# Patient Record
Sex: Male | Born: 1975 | Hispanic: Yes | Marital: Single | Smoking: Never smoker
Health system: Southern US, Community
[De-identification: ages and names within clinical notes are randomized; demographics above are authoritative.]

## PROBLEM LIST (undated history)

## (undated) HISTORY — PX: GASTRIC BYPASS: SHX52

## (undated) HISTORY — PX: CORONARY ARTERY BYPASS GRAFT: SHX141

---

## 2007-06-01 ENCOUNTER — Emergency Department: Payer: Self-pay | Admitting: Unknown Physician Specialty

## 2007-06-03 ENCOUNTER — Emergency Department: Payer: Self-pay | Admitting: Emergency Medicine

## 2008-06-21 IMAGING — CT CT STONE STUDY
1 of 2 series · 15 of 32 positions shown, 19 images · non-contrast
Comparison: none

REASON FOR EXAM: flank pain, [HOSPITAL]
COMMENTS:

[Series 2: stone · axial · 0.79mm/px · z∈[-534,-123]mm · 15 of 154 slices shown, 19 images]
[im 11/154  soft-tissue]
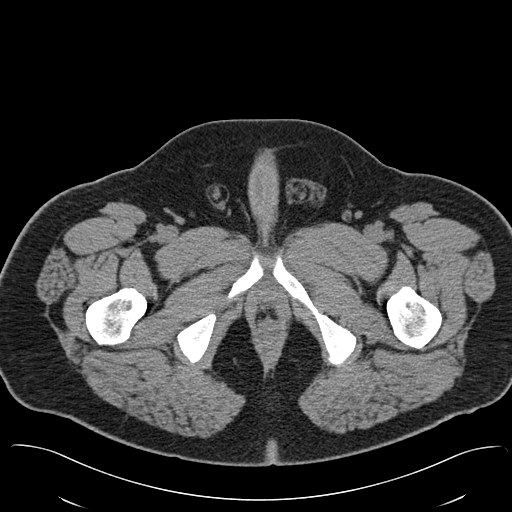
[im 11/154  bone]
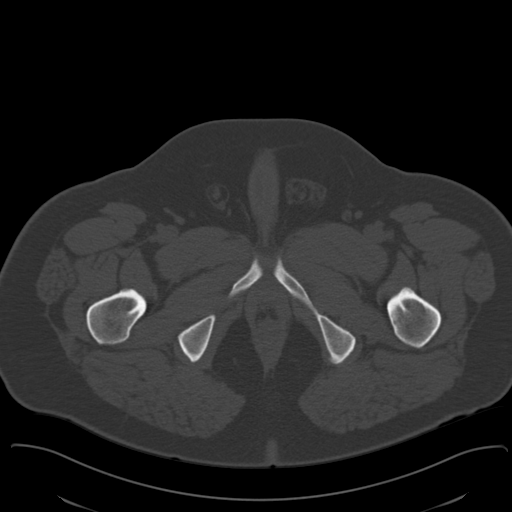
[im 22/154  soft-tissue]
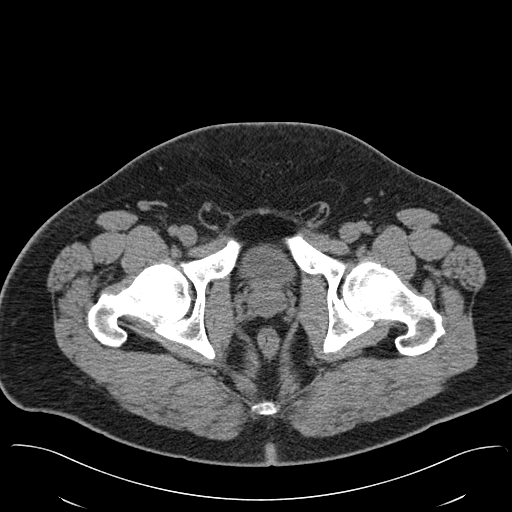
[im 32/154  soft-tissue]
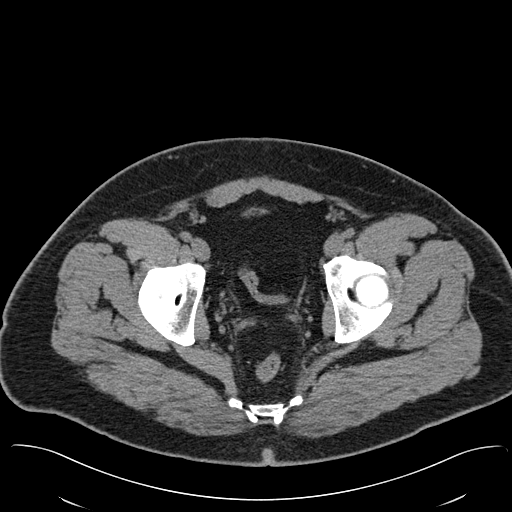
[im 43/154  soft-tissue]
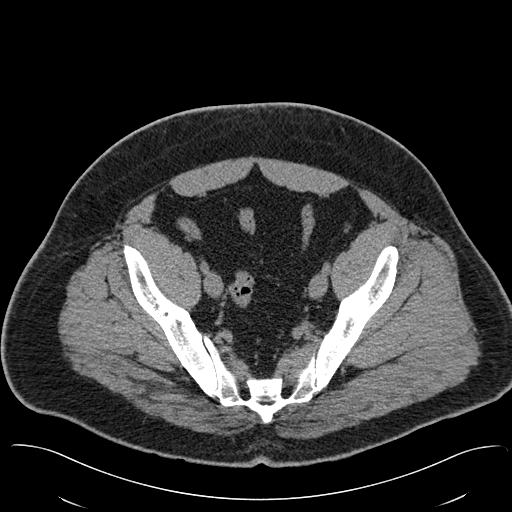
[im 53/154  soft-tissue]
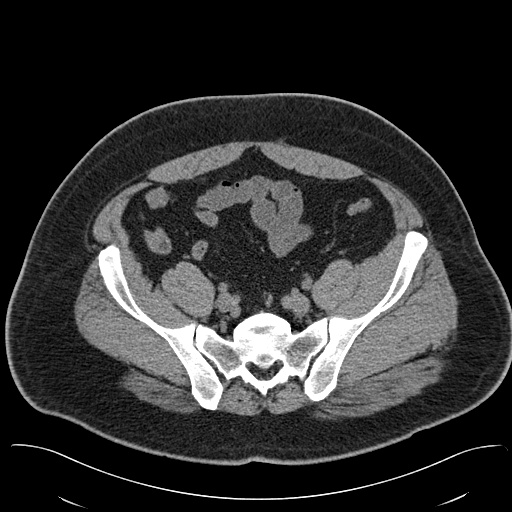
[im 64/154  soft-tissue]
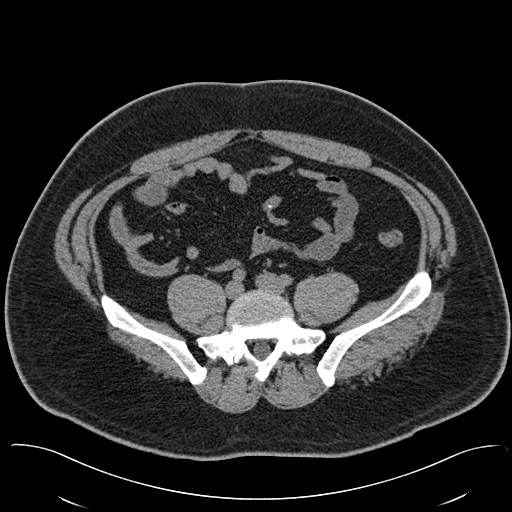
[im 80/154  soft-tissue]
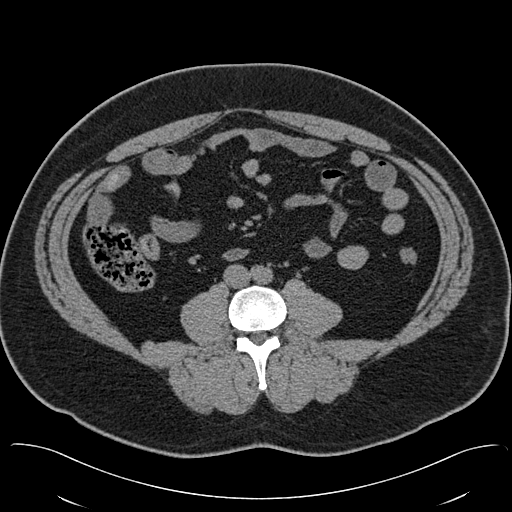
[im 90/154  soft-tissue]
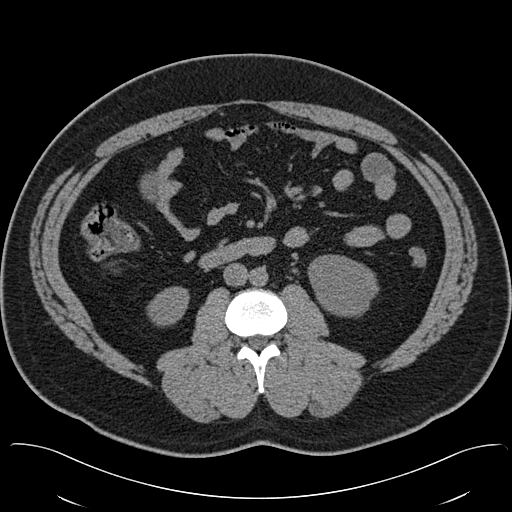
[im 101/154  soft-tissue]
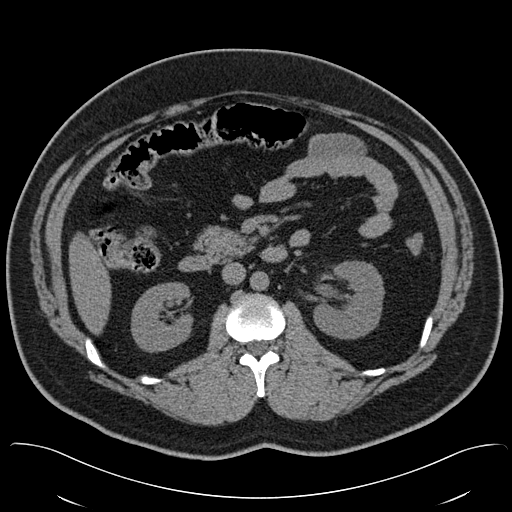
[im 101/154  bone]
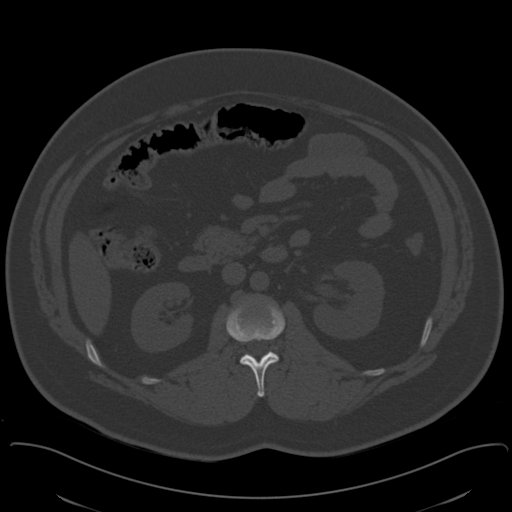
[im 111/154  soft-tissue]
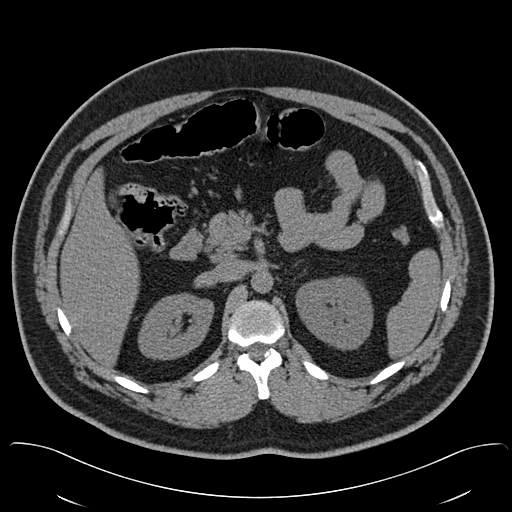
[im 122/154  soft-tissue]
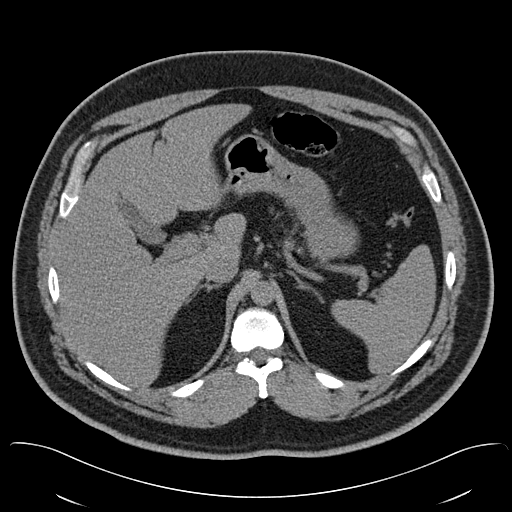
[im 132/154  soft-tissue]
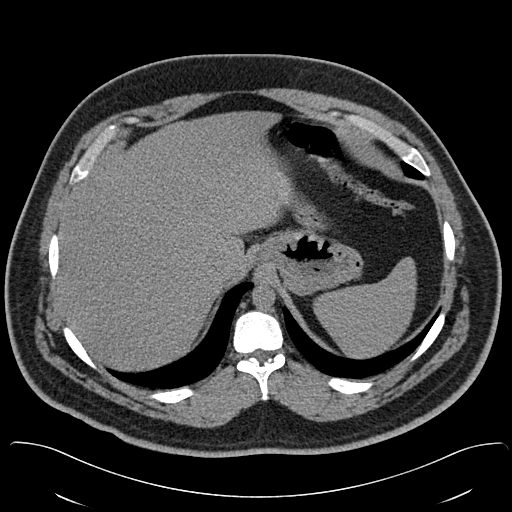
[im 132/154  lung]
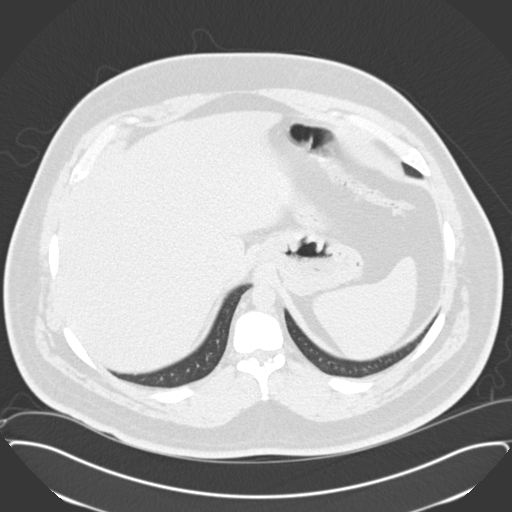
[im 138/154  lung]
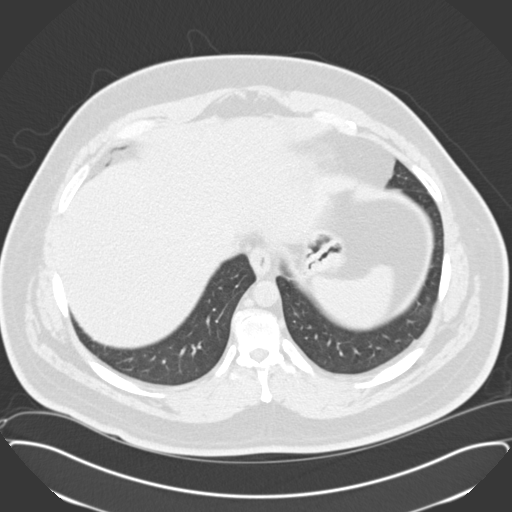
[im 143/154  soft-tissue]
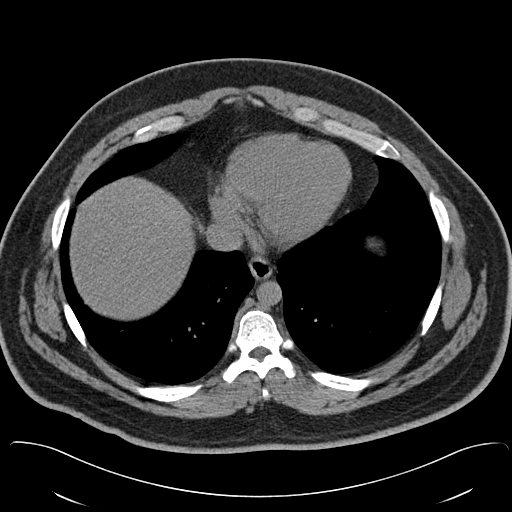
[im 143/154  lung]
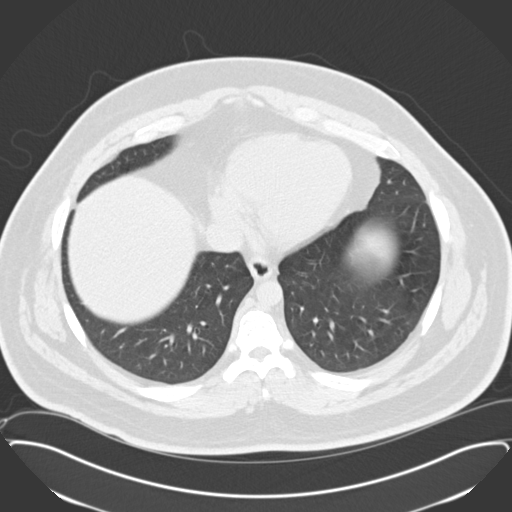
[im 148/154  lung]
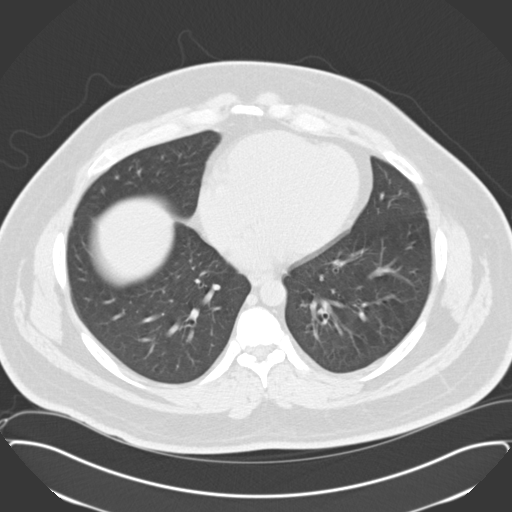

[15 of 32 positions shown; findings below may reference images not displayed]

PROCEDURE:     CT  - CT ABDOMEN /PELVIS WO (STONE)  - June 01, 2007  [DATE]

RESULT:       Helical noncontrasted 3-mm sections were obtained from the
lung bases through the pubic symphysis.

Evaluation of the lung bases demonstrates a calcified granuloma within the
anterior base of the RIGHT middle lobe.  No further gross abnormalities are
appreciated.

Evaluation of the LEFT kidney demonstrates mild hydronephrosis as well as
mild to moderate pelviectasis. A 3.5 mm calculus is demonstrated within the
proximal LEFT ureter just distal to the ureterovesical junction. Within the
limitations of a noncontrasted CT, the liver, spleen, RIGHT kidney, pancreas
and adrenals are unremarkable.  There is no evidence of abdominal or pelvic
free fluid, drainable loculated fluid collections, masses or adenopathy or
the sequela associated with bowel obstruction, diverticulitis, colitis or
appendicitis are appreciated.
IMPRESSION: 1.     Proximal LEFT ureteral calculus with associated mild to slightly
moderate obstructive uropathy.
2.     Dr. Quirijn of the Emergency Department was informed of these findings
via preliminary fax report on 06/01/07 at [DATE] a.m. CST.

## 2019-07-20 ENCOUNTER — Encounter: Payer: Self-pay | Admitting: Cardiovascular Disease

## 2019-08-08 NOTE — Progress Notes (Signed)
Cardiology Office Note  Date:  08/10/2019   ID:  Robert [name], DOB 1975-09-16, MRN [id number]  PCP:  Theotis Burrow, MD   Chief Complaint  Patient presents with  . New Patient (Initial Visit)    Bradycardia-Patient reports dizziness x 2 weeks; Meds verbally reviewed with patient.    HPI:  [name] is a 44 year old gentleman with past medical history of Gastric bypass, weight  338 to 161 over the past year Referred by Dr. 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  PSH:    Past Surgical History:  Procedure Laterality Date  . CORONARY ARTERY BYPASS GRAFT      Current Outpatient Medications  Medication Sig Dispense Refill  . Multiple Vitamins-Minerals (MULTIVITAMIN ADULT PO) Take by mouth daily.     No current facility-administered medications for this visit.     Allergies:   Patient has no known allergies.   Social History:  The patient  reports that he has never smoked. He has never used smokeless tobacco. He reports previous alcohol use. He reports that he does not use drugs.   Family  History:   family history includes Hypertension in his mother.    Review of Systems: Review of Systems  Constitutional: Negative.   HENT: Negative.   Respiratory: Negative.   Cardiovascular: Negative.   Gastrointestinal: Negative.   Musculoskeletal: Negative.   Neurological: Positive for dizziness.  Psychiatric/Behavioral: Negative.   All other systems reviewed and are negative.    PHYSICAL EXAM: VS:  BP 107/66 (BP Location: Right Arm, Patient Position: Sitting, Cuff Size: Normal)   Pulse (!) 48   Ht 5\' 4"  (1.626 m)   Wt 161 lb 4 oz (73.1 kg)   SpO2 99%   BMI 27.68 kg/m  , BMI Body mass index is 27.68 kg/m. GEN: Well nourished, well developed, in no acute distress HEENT: normal Neck: no JVD, carotid bruits, or masses Cardiac: RRR; no murmurs, rubs, or gallops,no edema  Respiratory:  clear to auscultation bilaterally, normal work of breathing GI: soft, nontender, nondistended, + BS MS: no deformity or atrophy Skin: warm and dry, no rash Neuro:  Strength and sensation are intact Psych: euthymic mood, full affect    Recent Labs: No results found for requested labs within last 8760 hours.    Lipid Panel No results found for: CHOL, HDL, LDLCALC, TRIG    Wt Readings from Last 3 Encounters:  08/10/19 161 lb 4 oz (73.1 kg)     ASSESSMENT AND PLAN:  Problem List Items Addressed This Visit    None  Visit Diagnoses    Bradycardia    -  Primary   History of gastric bypass       Weight loss       Orthostasis         Dramatic weight loss over the past year 160 pounds dramatic weight loss over the past year 160 pounds or so Exacerbated by recent 10 pound weight loss after developing GI disorder/Salmonella while traveling Does not drink much fluids in the daytime, simple couple coffee in the morning and does not eat till 9 AM --Mild orthostasis symptoms today Bradycardia less of an issue Biggest contributor to his symptoms is dramatic weight loss causing low  blood pressure -Recommend he limit further weight loss at this time -Suggested better hydration especially in the mornings and throughout his work shift  Orthostasis Recommended compression hose, back brace, hydration If symptoms do get worse, could potentially use midodrine or Florinef Or encouraged weight gain He does not need to avoid salt  Bradycardia Likely a chronic issue, pacemaker not indicated Recommended better hydration, avoiding prolonged periods of bending over and getting up too quickly   Disposition:   F/U as needed   Total encounter time more than 60 minutes  Greater than 50% was spent in counseling and coordination of care with the patient  Patient was seen in consultation for [facility] and will be referred back to his office for ongoing care of the issues detailed above   Signed, Dossie Arbour, M.D., Ph.D. [facility] [place], [place] [phone]

## 2019-08-10 ENCOUNTER — Ambulatory Visit (INDEPENDENT_AMBULATORY_CARE_PROVIDER_SITE_OTHER): Payer: BC Managed Care – PPO | Admitting: Cardiovascular Disease

## 2019-08-10 ENCOUNTER — Other Ambulatory Visit: Payer: Self-pay

## 2019-08-10 ENCOUNTER — Encounter: Payer: Self-pay | Admitting: Cardiovascular Disease

## 2019-08-10 VITALS — BP 107/66 | HR 48 | Ht 64.0 in | Wt 161.2 lb

## 2019-08-10 DIAGNOSIS — Z9884 Bariatric surgery status: Secondary | ICD-10-CM

## 2019-08-10 DIAGNOSIS — R634 Abnormal weight loss: Secondary | ICD-10-CM

## 2019-08-10 DIAGNOSIS — R001 Bradycardia, unspecified: Secondary | ICD-10-CM

## 2019-08-10 DIAGNOSIS — I951 Orthostatic hypotension: Secondary | ICD-10-CM | POA: Diagnosis not present

## 2019-08-10 NOTE — Patient Instructions (Signed)
Ask PMD about omeprazole Try snack before bed   Medication Instructions:  No changes  If you need a refill on your cardiac medications before your next appointment, please call your pharmacy.    Lab work: No new labs needed   If you have labs (blood work) drawn today and your tests are completely normal, you will receive your results only by: Marland Kitchen MyChart Message (if you have MyChart) OR . A paper copy in the mail If you have any lab test that is abnormal or we need to change your treatment, we will call you to review the results.   Testing/Procedures: No new testing needed   Follow-Up: At [facility], you and your health needs are our priority.  As part of our continuing mission to provide you with exceptional heart care, we have created designated Provider Care Teams.  These Care Teams include your primary Cardiologist (physician) and Advanced Practice Providers (APPs -  Physician Assistants and Nurse Practitioners) who all work together to provide you with the care you need, when you need it.  . You will need a follow up appointment as needed  . Providers on your designated Care Team:   . Nicolasa Ducking, NP . Eula Listen, PA-C . Marisue Ivan, PA-C  Any Other Special Instructions Will Be Listed Below (If Applicable).  For educational health videos Log in to : [email or web address] Or : [email or web address], password : [email or web address]

## 2021-10-21 ENCOUNTER — Emergency Department
Admission: EM | Admit: 2021-10-21 | Discharge: 2021-10-21 | Disposition: A | Discharge status: Home / Self Care | Payer: BC Managed Care – PPO | Attending: Emergency Medicine | Admitting: Emergency Medicine

## 2021-10-21 ENCOUNTER — Encounter: Payer: Self-pay | Admitting: Emergency Medicine

## 2021-10-21 ENCOUNTER — Other Ambulatory Visit: Payer: Self-pay

## 2021-10-21 DIAGNOSIS — S0990XA Unspecified injury of head, initial encounter: Secondary | ICD-10-CM | POA: Diagnosis present

## 2021-10-21 DIAGNOSIS — L02811 Cutaneous abscess of head [any part, except face]: Secondary | ICD-10-CM | POA: Diagnosis not present

## 2021-10-21 DIAGNOSIS — W208XXA Other cause of strike by thrown, projected or falling object, initial encounter: Secondary | ICD-10-CM | POA: Insufficient documentation

## 2021-10-21 MED ORDER — DOXYCYCLINE MONOHYDRATE 100 MG PO TABS
100.0000 mg | ORAL_TABLET | Freq: Two times a day (BID) | ORAL | 0 refills | Status: AC
Start: 2021-10-21 — End: 2021-10-26

## 2021-10-21 NOTE — ED Triage Notes (Signed)
Pt via POV from home. Pt states that a piece of metal at work hit him on the top of his head last Friday. Denies any LOC. Denies any NV. Pt has a very small hematoma on the anterior aspect of the top of his head. Pt is A&Ox4 and NAD ?

## 2021-10-21 NOTE — ED Provider Notes (Signed)
? ?[facility] ?Provider Note ? ? ? Event Date/Time  ? First MD Initiated Contact with Patient 10/21/21 1101   ?  (approximate) ? ? ?History  ? ?Head Injury ? ? ?HPI ? ?Robert [name] is a 46 y.o. male  with no pmh who presents with an injury to the head.  This happened about a week ago.  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  Says that it was at approximately the level of the top of his arms above his head when his partner dropped it and then fell on the top of his head.  Did not lose consciousness.  Denies any visual change numbness tingling weakness or neck pain.  He is not on blood thinners.  Has not had headache.  Has noticed a increasingly painful swollen area on the top of his head as his wife is concerned is infected.  Denies fevers. ? ?  ? ?History reviewed. No pertinent past medical history. ? ?There are no problems to display for this patient. ? ? ? ?Physical Exam  ?Triage Vital Signs: ?ED Triage Vitals  ?Enc Vitals Group  ?   BP 10/21/21 1029 108/70  ?   Pulse Rate 10/21/21 1029 (!) 52  ?   Resp 10/21/21 1029 16  ?   Temp 10/21/21 1029 98.4 ?F (36.9 ?C)  ?   Temp Source 10/21/21 1029 Oral  ?   SpO2 10/21/21 1029 99 %  ?   Weight 10/21/21 1023 150 lb (68 kg)  ?   Height 10/21/21 1023 5\' 2"  (1.575 m)  ?   Head Circumference --   ?   Peak Flow --   ?   Pain Score 10/21/21 1023 8  ?   Pain Loc --   ?   Pain Edu? --   ?   Excl. in GC? --   ? ? ?Most recent vital signs: ?Vitals:  ? 10/21/21 1029  ?BP: 108/70  ?Pulse: (!) 52  ?Resp: 16  ?Temp: 98.4 ?F (36.9 ?C)  ?SpO2: 99%  ? ? ? ?General: Awake, no distress.  ?CV:  Good peripheral perfusion.  ?Resp:  Normal effort.  ?Abd:  No distention.  ?Neuro:             Awake, Alert, Oriented x 3  ?Other:  Aox3, nml speech  ?PERRL, EOMI, face symmetric, nml tongue movement  ?5/5 strength in the BL upper and lower extremities  ?Sensation grossly intact in the BL upper and lower extremities   ?Finger-nose-finger intact BL ? ?Small area of erythema with fluctuance and crusting on the anterior middle scalp ? ? ?ED Results / Procedures / Treatments  ?Labs ?(all labs ordered are listed, but only abnormal results are displayed) ?Labs Reviewed - No data to display ? ? ?EKG ? ? ? ? ?RADIOLOGY ? ? ? ?PROCEDURES: ? ?Critical Care performed: No ? ?10/23/21.Incision and Drainage ? ?Date/Time: 10/21/2021 11:21 AM ?Performed by: 10/23/2021, MD ?Authorized by: Georga Hacking, MD  ? ?Consent:  ?  Consent obtained:  Verbal ?  Risks discussed:  Bleeding and pain ?  Alternatives discussed:  No treatment ?Universal protocol:  ?  Procedure explained and questions answered to patient or proxy's satisfaction: yes   ?  Relevant documents present and verified: yes   ?  Patient identity confirmed:  Verbally with patient ?Location:  ?  Type:  Abscess ?  Location:  Head ?  Head location:  Scalp ?Pre-procedure details:  ?  Skin preparation:  Chlorhexidine with alcohol ?Sedation:  ?  Sedation type:  None ?Anesthesia:  ?  Anesthesia method:  Local infiltration ?  Local anesthetic:  Lidocaine 1% w/o epi ?Procedure type:  ?  Complexity:  Simple ?Procedure details:  ?  Ultrasound guidance: no   ?  Incision types:  Stab incision ?  Incision depth:  Dermal ?  Drainage:  Purulent ?  Drainage amount:  Moderate ?  Wound treatment:  Wound left open ?  Packing materials:  None ?Post-procedure details:  ?  Procedure completion:  Tolerated well, no immediate complications ? ? ? ?MEDICATIONS ORDERED IN ED: ?Medications - No data to display ? ? ?IMPRESSION / MDM / ASSESSMENT AND PLAN / ED COURSE  ?I reviewed the triage vital signs and the nursing notes. ?             ?               ? ?The patient is a 46 year old male who presents with a small abscess on his anterior scalp.  About a week ago a piece of metal dropped onto his head was working and since that time he has noticed an area of fluctuance and tenderness top of his scalp.  He has no  neurologic symptoms to suggest acute intracranial hemorrhage and his neurologic exam is nonfocal and the fact that this happened a week ago I am not concerned for TBI do not feel that CT head is necessary at this time.  He does appear to have a small abscess on the top of his head which may be already spontaneously draining as there is some crusting.  I&D was performed with return of purulence.  Minimal surrounding cellulitis but will start on a course of doxycycline.  Discussed return precautions he is appropriate for discharge ? ?  ? ? ?FINAL CLINICAL IMPRESSION(S) / ED DIAGNOSES  ? ?Final diagnoses:  ?Abscess, scalp  ? ? ? ?Rx / DC Orders  ? ?ED Discharge Orders   ? ?      Ordered  ?  doxycycline (ADOXA) 100 MG tablet  2 times daily       ? 10/21/21 1118  ? ?  ?  ? ?  ? ? ? ?Note:  This document was prepared using Dragon voice recognition software and may include unintentional dictation errors. ?  ?Georga Hacking, MD ?10/21/21 1122 ? ?

## 2021-10-21 NOTE — Discharge Instructions (Addendum)
Please take the antibiotic twice a day for the next 5 days. If the area is becoming more painful or swollen, please return to the emergency department.  ?

## 2021-11-13 ENCOUNTER — Encounter: Payer: Self-pay | Admitting: Internal Medicine

## 2022-12-04 ENCOUNTER — Other Ambulatory Visit: Payer: Self-pay

## 2022-12-04 ENCOUNTER — Emergency Department
Admission: EM | Admit: 2022-12-04 | Discharge: 2022-12-04 | Disposition: A | Discharge status: Home / Self Care | Payer: Commercial Managed Care - PPO | Attending: Emergency Medicine | Admitting: Emergency Medicine

## 2022-12-04 DIAGNOSIS — L03213 Periorbital cellulitis: Secondary | ICD-10-CM | POA: Insufficient documentation

## 2022-12-04 DIAGNOSIS — H0012 Chalazion right lower eyelid: Secondary | ICD-10-CM | POA: Diagnosis present

## 2022-12-04 MED ORDER — TETRACAINE HCL 0.5 % OP SOLN
2.0000 [drp] | Freq: Once | OPHTHALMIC | Status: AC
  Administered 2022-12-04: 2 [drp] via OPHTHALMIC
  Filled 2022-12-04: qty 4

## 2022-12-04 MED ORDER — AMOXICILLIN-POT CLAVULANATE 875-125 MG PO TABS: 1.0000 | ORAL_TABLET | Freq: Two times a day (BID) | ORAL | 0 refills | Status: AC

## 2022-12-04 MED ORDER — FLUORESCEIN SODIUM 1 MG OP STRP
1.0000 | ORAL_STRIP | Freq: Once | OPHTHALMIC | Status: AC
  Administered 2022-12-04: 1 via OPHTHALMIC
  Filled 2022-12-04: qty 1

## 2022-12-04 MED ORDER — AMOXICILLIN-POT CLAVULANATE 875-125 MG PO TABS
1.0000 | ORAL_TABLET | Freq: Once | ORAL | Status: AC
  Administered 2022-12-04: 1 via ORAL
  Filled 2022-12-04: qty 1

## 2022-12-04 NOTE — ED Provider Notes (Signed)
[facility] Provider Note  Patient Contact: 7:31 PM (approximate)   History   Eye Pain   HPI  Robert [name] is a 47 y.o. male presents to the emergency department with a chalazion of the right lower eyelid with some mild surrounding cellulitis that has been present for 1 week.  Patient has no difficulty keeping his eye open.  No pain with extraocular eye muscle movement.      Physical Exam   Triage Vital Signs: ED Triage Vitals  Enc Vitals Group     BP 12/04/22 1745 102/68     Pulse Rate 12/04/22 1745 (!) 56     Resp 12/04/22 1745 17     Temp 12/04/22 1745 97.8 F (36.6 C)     Temp Source 12/04/22 1745 Oral     SpO2 12/04/22 1745 99 %     Weight 12/04/22 1746 155 lb (70.3 kg)     Height 12/04/22 1746 5\' 4"  (1.626 m)     Head Circumference --      Peak Flow --      Pain Score 12/04/22 1745 6     Pain Loc --      Pain Edu? --      Excl. in GC? --     Most recent vital signs: Vitals:   12/04/22 1745  BP: 102/68  Pulse: (!) 56  Resp: 17  Temp: 97.8 F (36.6 C)  SpO2: 99%     General: Alert and in no acute distress. Eyes:  PERRL. EOMI. patient has chalazion of right lower eyelid Head: No acute traumatic findings ENT:      Nose: No congestion/rhinnorhea.      Mouth/Throat: Mucous membranes are moist. Neck: No stridor. No cervical spine tenderness to palpation. Cardiovascular:  Good peripheral perfusion Respiratory: Normal respiratory effort without tachypnea or retractions. Lungs CTAB. Good air entry to the bases with no decreased or absent breath sounds. Gastrointestinal: Bowel sounds 4 quadrants. Soft and nontender to palpation. No guarding or rigidity. No palpable masses. No distention. No CVA tenderness. Musculoskeletal: Full range of motion to all extremities.  Neurologic:  No gross focal neurologic deficits are appreciated.  Skin:   No rash noted    ED Results / Procedures / Treatments   Labs (all labs ordered are  listed, but only abnormal results are displayed) Labs Reviewed - No data to display      PROCEDURES:  Critical Care performed: No  Procedures   MEDICATIONS ORDERED IN ED: Medications  tetracaine (PONTOCAINE) 0.5 % ophthalmic solution 2 drop (has no administration in time range)  fluorescein ophthalmic strip 1 strip (has no administration in time range)  amoxicillin-clavulanate (AUGMENTIN) 875-125 MG per tablet 1 tablet (has no administration in time range)     IMPRESSION / MDM / ASSESSMENT AND PLAN / ED COURSE  I reviewed the triage vital signs and the nursing notes.                              Assessment and plan Chalazion 47 year old male presents to the emergency department with a collision of the right lower eyelid has been present for the past week with some mild surrounding erythema that has persisted despite warm compresses.  Patient was bradycardic at triage but vital signs otherwise reassuring.  On physical exam, patient had no difficulty keeping his eyes open.  Will start patient on Augmentin twice daily for the next 7  days and will have him follow-up with ophthalmology, Dr. Inez Pilgrim.  Return precautions were given to return with new or worsening symptoms.  All patient questions were answered.      FINAL CLINICAL IMPRESSION(S) / ED DIAGNOSES   Final diagnoses:  Chalazion of right lower eyelid  Preseptal cellulitis     Rx / DC Orders   ED Discharge Orders          Ordered    amoxicillin-clavulanate (AUGMENTIN) 875-125 MG tablet  2 times daily        12/04/22 1900             Note:  This document was prepared using Dragon voice recognition software and may include unintentional dictation errors.   Pia Mau [place], PA-C 12/04/22 1933    Merwyn Katos, MD 12/04/22 [phone]

## 2022-12-04 NOTE — ED Triage Notes (Signed)
Pt to ED for redness and swelling to lower L eyelid since 1 weeka go. Pt in no acute distress. Painful with eye movement.

## 2022-12-04 NOTE — Discharge Instructions (Addendum)
Take Augmentin twice daily for seven days.  Please make follow up appointment with Dr. Inez Pilgrim.

## 2024-04-01 ENCOUNTER — Other Ambulatory Visit: Payer: Self-pay

## 2024-04-01 DIAGNOSIS — R7401 Elevation of levels of liver transaminase levels: Secondary | ICD-10-CM

## 2024-07-01 NOTE — Progress Notes (Signed)
  Cardiology Office Note   Date:  07/07/2024  ID:  Robert [name], DOB May 15, 1976, MRN [id number] PCP: Ernie Yancy Roof, MD  [facility] Providers Cardiologist:  Caron Poser, MD     History of Present Illness Robert [name] is a 48 y.o. male PMH LUTS, gastric bypass 2020 who presents for further evaluation of hypotension and bradycardia.  Patient reports he has been dealing with this issue since his gastric bypass surgery in 2020.  The predominant symptom he has is dizziness with standing.  He denies any frank syncope.  He reports he seems to have good exertional capacity.  He notes he can climb a flight or 2 of steps without issue.  He spends a lot of time working on his knees and then notices the dizziness when he stands.  Relevant CVD History -TTE 02/2018 normal biventricular function with no significant valvular disease   ROS: Pt denies any chest discomfort, jaw pain, arm pain, palpitations, syncope, presyncope, orthopnea, PND, or LE edema.  Studies Reviewed I have independently reviewed the patient's ECG, recent medical records, previous cardiac testing.  Physical Exam VS:  BP (!) 98/58 (BP Location: Left Arm, Patient Position: Sitting, Cuff Size: Normal)   Pulse (!) 51   Ht 5' 5 (1.651 m)   Wt 155 lb (70.3 kg)   SpO2 99%   BMI 25.79 kg/m   Orthostatic VS for the past 24 hrs (Last 3 readings):  BP- Lying Pulse- Lying BP- Sitting Pulse- Sitting BP- Standing at 0 minutes Pulse- Standing at 0 minutes BP- Standing at 3 minutes Pulse- Standing at 3 minutes  07/07/24 1109 104/61 75 95/54 51 95/57 55 97/54 52      Wt Readings from Last 3 Encounters:  07/07/24 155 lb (70.3 kg)  12/04/22 155 lb (70.3 kg)  10/21/21 150 lb (68 kg)    GEN: No acute distress. NECK: No JVD; No carotid bruits. CARDIAC: RRR, no murmurs, rubs, gallops. RESPIRATORY:  Clear to auscultation. EXTREMITIES:  Warm and well-perfused. No edema.  ASSESSMENT AND  PLAN Bradycardia Hypotension Dizziness Somewhat unclear cause of his dizziness.  He had normal orthostatic vital signs in office today.  He does take finasteride for LUTS, but reports that he is unable to really stop this medication.  He also notes that he feels cold frequently and has numbness and tingling in his fingers.  I think it is worth looking into cardiac causes of the symptoms, but I do also wonder about thyroid or vitamin deficiencies given his history of gastric bypass surgery.  Plan: - Echocardiogram to evaluate for structural causes of his symptoms - Monitor to rule out any high-risk brady arrhythmias that could be causing his symptoms - ETT to evaluate for chronotropic incompetence - Check TSH - As above, if the above workup is all normal, then would seek out additional metabolic causes such as vitamin deficiencies     Informed Consent   The risks [chest pain, shortness of breath, cardiac arrhythmias, dizziness, blood pressure fluctuations, myocardial infarction, stroke/transient ischemic attack, and life-threatening complications (estimated to be 1 in 10,000)], benefits (risk stratification, diagnosing coronary artery disease, treatment guidance) and alternatives of an exercise tolerance test were discussed in detail with [name] and he agrees to proceed.     Dispo: RTC 3 months or sooner as needed  Signed, Caron Poser, MD

## 2024-07-07 ENCOUNTER — Ambulatory Visit

## 2024-07-07 VITALS — BP 98/58 | HR 51 | Ht 65.0 in | Wt 155.0 lb

## 2024-07-07 DIAGNOSIS — R42 Dizziness and giddiness: Secondary | ICD-10-CM | POA: Diagnosis not present

## 2024-07-07 DIAGNOSIS — I9589 Other hypotension: Secondary | ICD-10-CM

## 2024-07-07 DIAGNOSIS — R001 Bradycardia, unspecified: Secondary | ICD-10-CM | POA: Diagnosis not present

## 2024-07-07 DIAGNOSIS — Z79899 Other long term (current) drug therapy: Secondary | ICD-10-CM

## 2024-07-07 NOTE — Patient Instructions (Addendum)
 Medication [name]:  Your physician recommends that you continue on your current medications as directed. Please refer to the Current Medication list given to you today.  *If you need a refill on your cardiac medications before your next appointment, please call your pharmacy*  Lab Work: Your provider would like for you to have following labs drawn today TSH.   If you have labs (blood work) drawn today and your tests are completely normal, you will receive your results only by: MyChart Message (if you have MyChart) OR A paper copy in the mail If you have any lab test that is abnormal or we need to change your treatment, we will call you to review the results.  Testing/Procedures:  Su mdico le ha solicitado una ecocardiografa. La ecocardiografa es una prueba indolora que utiliza ondas sonoras para crear imgenes del corazn. Proporciona informacin sobre el tamao y la forma del corazn, as como sobre el funcionamiento de sus cavidades y vlvulas.   Si es necesario, podra recibir un agente potenciador de ultrasonidos por va intravenosa para visualizar mejor el public relations account executive.  Este procedimiento dura aproximadamente una hora.  No existen restricciones para este procedimiento.  Esto se education officer, environmental en [address] ([facility] [address], [place] [address]  (Your physician has requested that you have an echocardiogram. Echocardiography is a painless test that uses sound waves to create images of your heart. It provides your doctor with information about the size and shape of your heart and how well your heart's chambers and valves are working.   You may receive an ultrasound enhancing agent through an IV if needed to better visualize your heart during the echo. This procedure takes approximately one hour.  There are no restrictions for this procedure. )   Please note: We ask at that you not bring children with you during ultrasound (echo/ vascular)  testing. Due to room size and safety concerns, children are not allowed in the ultrasound rooms during exams. Our front office staff cannot provide observation of children in our lobby area while testing is being conducted. An adult accompanying a patient to their appointment will only be allowed in the ultrasound room at the discretion of the ultrasound technician under special circumstances. We apologize for any inconvenience.   EXERCISE TOLERANCE TEST (ETT)  Su proveedor le ha solicitado una prueba de tolerancia al ejercicio. Esta prueba evaluar el riego sanguneo del msculo cardaco durante los perodos de ejercicio y descanso. Para esta prueba, aumentar su frecuencia cardaca caminando en una cinta de correr a [employer].   Puede tomar un desayuno o almuerzo ligero antes del procedimiento.  No consumir cafena durante las 24 horas previas a la prueba (caf, t, refrescos o chocolate).  No fumar ni vapear durante las 4 horas previas a la prueba.  Puede tomar sus medicamentos habituales el da de la prueba.  Traiga sus inhaladores (si los usa  actualmente) a la prueba.  Use ropa cmoda y zapatos deportivos o antideslizantes para caminar en la cinta.  Esto se education officer, environmental en [address] ([facility] [place] [address].  (Your provider has ordered a exercise tolerance test. This test will evaluate the blood supply to your heart muscle during periods of exercise and rest. For this test, you will raise your heart rate by walking on a treadmill at different levels.   you may eat a light breakfast/ lunch prior to your procedure no caffeine for 24 hours prior to your test (coffee, tea, soft drinks,  or chocolate)  no smoking/ vaping for 4 hours prior to your test you may take your regular medications the day of your test  bring any inhalers (if you currently use an inhaler) with you to your test wear comfortable clothing & tennis/ non-skid shoes to walk on the  treadmill  This will take place at [address] [facility])  [place] [phone] )  Robert [name]  Your physician has requested you wear a ZIO patch monitor for 14 days.  This is a single patch monitor. [employer] supplies one patch monitor per enrollment. Additional stickers are not available. Please do not apply patch if you will be having a Nuclear Stress Test, Echocardiogram, Cardiac CT, MRI, or Chest Xray during the period you would be wearing the monitor. The patch cannot be worn during these tests. You cannot remove and re-apply the ZIO XT patch monitor.  Your ZIO patch monitor will be mailed 3 day USPS to your address on file. It may take 3-5 days to receive your monitor after you have been enrolled. Once you have received your monitor, please review the enclosed [name]. Your monitor has already been registered assigning a specific monitor serial number to you.  Billing and Patient Assistance Program Information  We have supplied [employer] with any of your insurance information on file for billing purposes.  [employer] offers a sliding scale Patient Assistance Program for patients that do not have insurance, or whose insurance does not completely cover the cost of the ZIO monitor.  You must apply for the Patient Assistance Program to qualify for this discounted rate.  To apply, please call [employer] at [phone], select option 4, select option 2, ask to apply for Patient Assistance Program. M[place]eth will ask your household income, and how many people are in your household. They will quote your out-of-pocket cost based on that information. [employer] will also be able to set up a 101-month, interest-free payment plan if needed.  Applying the monitor   Shave hair from upper left chest.  Hold abrader disc by orange tab. Rub abrader in 40 strokes over the upper left chest as indicated in your monitor [name].  Clean area with 4 enclosed alcohol pads. Let  dry.  Apply patch as indicated in monitor [name]. Patch will be placed under collarbone on left side of chest with arrow pointing upward.  Rub patch adhesive wings for 2 minutes. Remove white label marked 1. Remove the white label marked 2. Rub patch adhesive wings for 2 additional minutes.  While looking in a mirror, press and release button in center of patch. A small green light will flash 3-4 times. This will be your only indicator that the monitor has been turned on.   After Applying Monitor: Do not shower for the first 24 hours. You may shower after the first 24 hours.  Press the button if you feel a symptom. You will hear a small click. Record Date, Time and Symptom in the Patient Logbook.   After Completing 14 Days: When you are ready to remove the patch, follow [name] on the last 2 pages of Patient Logbook.  Stick patch monitor into the tabs at the bottom of the return box.  Place Patient Logbook in the blue and white box. Use locking tab on box and tape box closed securely. The blue and white box has prepaid postage on it. Please place it in the mailbox as soon as possible. Your physician should have your test results  approximately 7-14 days after the monitor has been mailed back to [place].   Troubleshooting: Call [facility] at [phone] if you have questions regarding your ZIO XT patch monitor.  Call them immediately if you see an orange light blinking on your monitor.  If your monitor falls off in less than 4 days, contact our Monitor department at [phone].  If your monitor becomes loose or falls off after 4 days call [employer] at [phone] for suggestions on securing your monitor.   Follow-Up: At [facility], you and your health needs are our priority.  As part of our continuing mission to provide you with exceptional heart care, our providers are all part of one team.  This team includes your primary Cardiologist  (physician) and Advanced Practice Providers or APPs (Physician Assistants and Nurse Practitioners) who all work together to provide you with the care you need, when you need it.  Your next appointment:  3 month(s)  Provider:  Caron Poser, MD   We recommend signing up for the patient portal called MyChart.  Sign up information is provided on this After Visit Summary.  MyChart is used to connect with patients for Virtual Visits (Telemedicine).  Patients are able to view lab/test results, encounter notes, upcoming appointments, etc.  Non-urgent messages can be sent to your provider as well.   To learn more about what you can do with MyChart, go to [email or web address].

## 2024-07-08 ENCOUNTER — Ambulatory Visit: Payer: Self-pay

## 2024-07-08 LAB — TSH: TSH: 1.01 u[IU]/mL (ref 0.450–4.500)

## 2024-08-04 DIAGNOSIS — I9589 Other hypotension: Secondary | ICD-10-CM | POA: Diagnosis not present

## 2024-08-04 DIAGNOSIS — R001 Bradycardia, unspecified: Secondary | ICD-10-CM

## 2024-08-21 ENCOUNTER — Ambulatory Visit

## 2024-08-21 DIAGNOSIS — I9589 Other hypotension: Secondary | ICD-10-CM

## 2024-08-21 DIAGNOSIS — R001 Bradycardia, unspecified: Secondary | ICD-10-CM

## 2024-08-26 LAB — ECHOCARDIOGRAM COMPLETE
AR max vel: 2.6 cm2
AV Area VTI: 2.42 cm2
AV Area mean vel: 2.38 cm2
AV Mean grad: 3 mmHg
AV Peak grad: 5.9 mmHg
Ao pk vel: 1.21 m/s
Area-P 1/2: 3.03 cm2
S' Lateral: 2.76 cm

## 2024-09-16 ENCOUNTER — Ambulatory Visit

## 2024-10-09 ENCOUNTER — Ambulatory Visit
